# Patient Record
Sex: Male | Born: 1970 | Race: White | Hispanic: No | Smoking: Current every day smoker
Health system: Southern US, Community
[De-identification: ages and names within clinical notes are randomized; demographics above are authoritative.]

## PROBLEM LIST (undated history)

## (undated) DIAGNOSIS — S069XAA Unspecified intracranial injury with loss of consciousness status unknown, initial encounter: Secondary | ICD-10-CM

## (undated) DIAGNOSIS — S069X9A Unspecified intracranial injury with loss of consciousness of unspecified duration, initial encounter: Secondary | ICD-10-CM

---

## 2007-01-16 ENCOUNTER — Ambulatory Visit (HOSPITAL_COMMUNITY): Admission: RE | Admit: 2007-01-16 | Discharge: 2007-01-16 | Payer: Self-pay | Admitting: Family Medicine

## 2007-12-01 ENCOUNTER — Emergency Department (HOSPITAL_COMMUNITY): Admission: EM | Admit: 2007-12-01 | Discharge: 2007-12-01 | Payer: Self-pay | Admitting: Emergency Medicine

## 2008-03-15 ENCOUNTER — Inpatient Hospital Stay (HOSPITAL_COMMUNITY): Admission: EM | Admit: 2008-03-15 | Discharge: 2008-03-16 | Payer: Self-pay | Admitting: Emergency Medicine

## 2009-12-19 ENCOUNTER — Emergency Department (HOSPITAL_COMMUNITY): Admission: EM | Admit: 2009-12-19 | Discharge: 2009-12-19 | Payer: Self-pay | Admitting: Emergency Medicine

## 2010-10-26 NOTE — H&P (Signed)
NAME:  Jordan Wise, Jordan Wise                 ACCOUNT NO.:  [id number]   MEDICAL RECORD NO.:  [id number]          PATIENT TYPE:  INP   LOCATION:  A302                          FACILITY:  [facility]   PHYSICIAN:  Dorris Singh, DO    DATE OF BIRTH:  May 11, 1971   DATE OF ADMISSION:  03/15/2008  DATE OF DISCHARGE:  LH                              HISTORY & PHYSICAL   HISTORY OF PRESENT ILLNESS:  The patient is a 40 year old Caucasian male  who presented to the emergency room via EMS after being found passed out  by his girlfriend in his room.  The patient states that prior to this he  had been trying to pick up a plow, and threw his back out.  It took him  about 10 minutes to gather himself to get together, and he ended up  taking some pain medication that he had at home, then he went over to a  friend's house and took some morphine that his friend had.  All reports  stated that he took morphine, Vicodin, Valium.  When his girlfriend came  home, she found him passed out and unresponsive.  The patient was then  admitted to the service of [facility] with an apparent suicide attempt.  He took about three Valium, three hydrocodone and Xanax, and like I  said, what was revealed up on the hospital floor that he also did have  some morphine.   PRIMARY CARE PHYSICIAN:  Dr. Regino Schultze   ALLERGIES:  NO KNOWN DRUG ALLERGIES.   SOCIAL HISTORY:  Currently, he lives with his fiance.  He smokes about  two packs a day.  Just recently got out of jail for 45 days worth of  jail.  He is a former drinker and former drug user.   PAST MEDICAL HISTORY:  1. Arthritis and chronic back pain.  2. He is taking Celebrex 300 mg twice a day.  3. Diazepam 10 mg p.o. every 6 hours.  4. Hydrocodone/acetaminophen 10/650.  5. Mobic 15 mg once a day.   REVIEW OF SYSTEMS:  At that time, was positive unresponsiveness.  The  patient actually has improved.  He is able to answer questions  appropriately.  All the others are negative,  except for musculoskeletal  is positive back pain.   PHYSICAL EXAMINATION:  VITAL SIGNS:  For today are as follows,  temperature is 98.7, pulse 76, respirations 20, blood pressure 124/68.  GENERAL:  The patient is a 40 year old Caucasian male who is well-  developed, well-nourished in no acute distress.  HEENT:  Head is normocephalic, atraumatic.  Eyes: He does have a  deviated left eye, and they are bloodshot.  Conjunctive is red  bilaterally.  Nose:  Turbinates are moist.  Teeth are in poor repair.  NECK:  Supple.  CHEST:  Symmetrical respirations.  HEART:  Regular rate and rhythm.  LUNGS:  Clear to auscultation.  However, coarse breath sounds  throughout.  ABDOMEN:  Soft, nontender, nondistended, no organomegaly noted.  EXTREMITIES:  Positive pulses.  No ecchymosis, cyanosis or edema.   LABORATORY DATA:  Labs that were  done today include a blood gas which  showed pH of 7.29, CO2 was critical at the time, and his bicarbonate was  28, pCO2 30, white count of 6.0, hemoglobin 14.3, hematocrit 42.0,  platelet count 235.  Sodium 142, potassium 3.8, chloride 103, glucose  126, BUN 13, creatinine 1.4.  First set of cardiac enzymes were  negative.  Acetaminophen is less than one.  He is positive for  benzodiazepines, opiates and for pot.  For cocaine, he is negative.  Alcohol of less than 5.   ASSESSMENT:  1. Opiate overdose as well as benzodiazepine overdose.  2. Tobacco abuse.  3. Chronic pain   PLAN:  The patient was admitted to the service of [facility].  At the  time of admission, he actually became more alert and EKG was done as  well as blood work.  He was given Narcan as well in the ED which seemed  to revise him.  So, we will do DVT and GI prophylaxis.  Will increase  his diet as tolerated.  Will have the ACT team come see the patient to  evaluate him.  Will also put him on Nicoderm patch due to the fact that  he smokes two packs a day.  Have counseled the patient on smoking  as  well as use of narcotics and illicit drug use.  Will go ahead and  continue to monitor him as long as he continues to do well, and after he  is seen by ACT team, we will plan on discharge planning in 1-2 days.      Dorris Singh, DO  Electronically Signed     CB/MEDQ  D:  03/15/2008  T:  03/16/2008  Job:  [id number]   cc:   Kirk Ruths, M.D.  Fax: [phone]

## 2010-10-26 NOTE — Discharge Summary (Signed)
NAME:  Jordan Wise, [place]                 ACCOUNT NO.:  [id number]   MEDICAL RECORD NO.:  [id number]          PATIENT TYPE:  INP   LOCATION:  A302                          FACILITY:  [facility]   PHYSICIAN:  Skeet Latch, DO    DATE OF BIRTH:  Dec 22, 1970   DATE OF ADMISSION:  03/15/2008  DATE OF DISCHARGE:  10/04/2009LH                               DISCHARGE SUMMARY   DISCHARGE DIAGNOSES:  1. Opioid, and is overdosed.  2. Tobacco abuse.  3. History of chronic pain.   PRIMARY CARE PHYSICIAN:  Kirk Ruths, MD   BRIEF HOSPITAL COURSE:  This is a 40 year old Caucasian male presented  to the ER via EMS, was found passed out by his girlfriend in his room.  The patient states that he was trying to pick up a plow, hurt his back  and about 10 minutes to gather himself and ended up taking some pain  medication he had at home.  The patient went over a friend's house,  apparently he took morphine, Vicodin, and Valium.  When his girlfriend  came home, he was found unresponsive.  The patient was then brought to  the emergency room via ambulance with a questionable suicide attempt.  Apparently, he took about 3 Valium, 3 hydrocodone, and Xanax and  apparently also morphine.  The patient was admitted for drug overdose.  He became alert in the emergency room prior to being intubated.  He was  given Narcan in the emergency room also.  The patient has been stable,  subsequently being admitted to the [facility].  The patient was  placed on IV fluid and his diet was increased.  The patient had all  narcotics and sedatives held.  His initial labs showed sodium 142,  potassium 3.8, chloride 103, glucose 126, BUN 13, and creatinine 1.4.  The patient had cardiac enzymes.  Last cardiac enzyme showed a total CK  of 39, CK-MB 0.4, and troponin less than 0.03.  AST 43, ALT 54, and  total protein 5.8.  The patient had a repeat ABG, showed a pH of 7.394,  PCO2 of 48.4, PO2 of 82.2, and bicarb 28.9.  Chest  x-ray showed no acute  abnormalities.  At this time, we feel the patient is stable to be sent  home.  The patient did see ACT team and has signed safety contract with  the ACT team.   VITALS ON DISCHARGE:  Temperature 99.4, pulse 86, respirations 16, and  blood pressure 129/73.   CONDITION ON DISCHARGE:  Stable.   DISPOSITION:  The patient will be discharged to home.   DISCHARGE INSTRUCTIONS:  The patient is to follow up with Dr. Regino Schultze in  the next 1-2 days.  The patient has had no restrictions on diet.  Increase his activity slowly.  The patient is only to take medications  as directed.   DISCHARGE MEDICATIONS:  Diazepam 10 mg every 6 hours, Celebrex 300 mg  daily, and Tylenol 650 mg every 4-6 hours as needed for pain and fever.   The patient is to return to emergency room for any  major symptoms at  this time.      Skeet Latch, DO  Electronically Signed     SM/MEDQ  D:  03/16/2008  T:  03/16/2008  Job:  [id number]   cc:   Kirk Ruths, M.D.  Fax: [phone]

## 2010-11-24 ENCOUNTER — Emergency Department (HOSPITAL_COMMUNITY): Payer: Worker's Compensation

## 2010-11-24 ENCOUNTER — Inpatient Hospital Stay (HOSPITAL_COMMUNITY)
Admission: AD | Admit: 2010-11-24 | Discharge: 2010-11-29 | DRG: 564 | Disposition: A | Discharge status: Home / Self Care | Payer: Worker's Compensation | Source: Other Acute Inpatient Hospital | Attending: General Surgery | Admitting: General Surgery

## 2010-11-24 ENCOUNTER — Emergency Department (HOSPITAL_COMMUNITY)
Admission: EM | Admit: 2010-11-24 | Discharge: 2010-11-24 | Disposition: A | Discharge status: Other Acute Inpatient Hospital | Payer: Worker's Compensation | Source: Home / Self Care | Attending: Emergency Medicine | Admitting: Emergency Medicine

## 2010-11-24 DIAGNOSIS — F17200 Nicotine dependence, unspecified, uncomplicated: Secondary | ICD-10-CM | POA: Diagnosis present

## 2010-11-24 DIAGNOSIS — S1093XA Contusion of unspecified part of neck, initial encounter: Secondary | ICD-10-CM | POA: Insufficient documentation

## 2010-11-24 DIAGNOSIS — W208XXA Other cause of strike by thrown, projected or falling object, initial encounter: Secondary | ICD-10-CM | POA: Diagnosis present

## 2010-11-24 DIAGNOSIS — L723 Sebaceous cyst: Secondary | ICD-10-CM | POA: Diagnosis present

## 2010-11-24 DIAGNOSIS — K089 Disorder of teeth and supporting structures, unspecified: Secondary | ICD-10-CM | POA: Insufficient documentation

## 2010-11-24 DIAGNOSIS — S0180XA Unspecified open wound of other part of head, initial encounter: Secondary | ICD-10-CM | POA: Diagnosis present

## 2010-11-24 DIAGNOSIS — R51 Headache: Secondary | ICD-10-CM | POA: Insufficient documentation

## 2010-11-24 DIAGNOSIS — R000 Tachycardia, unspecified: Secondary | ICD-10-CM | POA: Insufficient documentation

## 2010-11-24 DIAGNOSIS — M549 Dorsalgia, unspecified: Secondary | ICD-10-CM | POA: Insufficient documentation

## 2010-11-24 DIAGNOSIS — S0003XA Contusion of scalp, initial encounter: Secondary | ICD-10-CM | POA: Insufficient documentation

## 2010-11-24 DIAGNOSIS — S0280XA Fracture of other specified skull and facial bones, unspecified side, initial encounter for closed fracture: Principal | ICD-10-CM | POA: Diagnosis present

## 2010-11-24 DIAGNOSIS — S02109A Fracture of base of skull, unspecified side, initial encounter for closed fracture: Secondary | ICD-10-CM | POA: Diagnosis present

## 2010-11-24 DIAGNOSIS — Y92009 Unspecified place in unspecified non-institutional (private) residence as the place of occurrence of the external cause: Secondary | ICD-10-CM

## 2010-11-24 DIAGNOSIS — S020XXA Fracture of vault of skull, initial encounter for closed fracture: Secondary | ICD-10-CM | POA: Insufficient documentation

## 2010-11-24 DIAGNOSIS — Y93H2 Activity, gardening and landscaping: Secondary | ICD-10-CM

## 2010-11-24 DIAGNOSIS — M542 Cervicalgia: Secondary | ICD-10-CM | POA: Insufficient documentation

## 2010-11-24 DIAGNOSIS — Y998 Other external cause status: Secondary | ICD-10-CM | POA: Insufficient documentation

## 2010-11-24 DIAGNOSIS — I10 Essential (primary) hypertension: Secondary | ICD-10-CM | POA: Insufficient documentation

## 2010-11-24 DIAGNOSIS — S0230XA Fracture of orbital floor, unspecified side, initial encounter for closed fracture: Secondary | ICD-10-CM | POA: Insufficient documentation

## 2010-11-24 DIAGNOSIS — Y999 Unspecified external cause status: Secondary | ICD-10-CM

## 2010-11-24 DIAGNOSIS — S0990XA Unspecified injury of head, initial encounter: Secondary | ICD-10-CM | POA: Insufficient documentation

## 2010-11-24 DIAGNOSIS — S022XXA Fracture of nasal bones, initial encounter for closed fracture: Secondary | ICD-10-CM | POA: Diagnosis present

## 2010-11-24 LAB — MRSA PCR SCREENING: MRSA by PCR: NEGATIVE

## 2010-11-24 LAB — POCT I-STAT, CHEM 8
Chloride: 103 mEq/L (ref 96–112)
Creatinine, Ser: 1 mg/dL (ref 0.4–1.5)
Glucose, Bld: 101 mg/dL — ABNORMAL HIGH (ref 70–99)
Sodium: 138 mEq/L (ref 135–145)
TCO2: 26 mmol/L (ref 0–100)

## 2010-11-25 LAB — BASIC METABOLIC PANEL
CO2: 28 mEq/L (ref 19–32)
Calcium: 8.2 mg/dL — ABNORMAL LOW (ref 8.4–10.5)
Chloride: 101 mEq/L (ref 96–112)
Creatinine, Ser: 0.75 mg/dL (ref 0.4–1.5)
GFR calc Af Amer: >60 mL/min (ref >60)
Glucose, Bld: 90 mg/dL (ref 70–99)
Potassium: 3.3 mEq/L — ABNORMAL LOW (ref 3.5–5.1)
Sodium: 137 mEq/L (ref 135–145)

## 2010-11-25 LAB — CBC
HCT: 38.1 % — ABNORMAL LOW (ref 39.0–52.0)
Hemoglobin: 13.2 g/dL (ref 13.0–17.0)
MCH: 32.6 pg (ref 26.0–34.0)
MCHC: 34.6 g/dL (ref 30.0–36.0)
MCV: 94.1 fL (ref 78.0–100.0)
RDW: 12.9 % (ref 11.5–15.5)

## 2010-11-26 ENCOUNTER — Inpatient Hospital Stay (HOSPITAL_COMMUNITY): Payer: Worker's Compensation

## 2010-11-26 LAB — BASIC METABOLIC PANEL
CO2: 33 mEq/L — ABNORMAL HIGH (ref 19–32)
GFR calc non Af Amer: >60 mL/min (ref >60)
Glucose, Bld: 97 mg/dL (ref 70–99)
Potassium: 3.8 mEq/L (ref 3.5–5.1)
Sodium: 138 mEq/L (ref 135–145)

## 2010-11-26 LAB — CBC
Hemoglobin: 12.3 g/dL — ABNORMAL LOW (ref 13.0–17.0)
MCH: 31.6 pg (ref 26.0–34.0)
MCHC: 33.6 g/dL (ref 30.0–36.0)
RDW: 12.7 % (ref 11.5–15.5)

## 2010-11-26 NOTE — Consult Note (Signed)
NAME:  Jordan Wise, Jordan Wise                 ACCOUNT NO.:  [id number]  MEDICAL RECORD NO.:  [id number]  LOCATION:  3103                         FACILITY:  [facility]  PHYSICIAN:  Hewitt Shorts, M.D.DATE OF BIRTH:  1970/06/24  DATE OF CONSULTATION:  11/24/2010 DATE OF DISCHARGE:                                CONSULTATION   HISTORY OF PRESENT ILLNESS:  The patient is a 40 year old right-handed white male who sustained a multiple trauma this afternoon.  He reports that he was cutting down a tree about a foot and a half in diameter. As it fell, it struck a rock and bounced back and struck him in the left craniofacial region.  He had a brief loss of conscious about a minute or so and then was taken to the [facility] Emergency Room, evaluated by Dr. Rhea Bleacher, the emergency room physician including CT scan of the head and face and he was found to have significant craniofacial trauma.  Arrangements were made for transfer to the Trauma Surgical Service at [facility] and neurosurgical consultation was requested.  Symptomatically he complains of headache.  He does not describe nausea at this time.  He knows that his vision is good in both eyes.  PAST MEDICAL HISTORY:  He denies any history of hypertension, myocardial function, cancer, stroke, diabetes, peptic ulcer disease, or lung disease.  No previous surgeries.  No known allergies.  He takes no medications on a regular basis.  FAMILY HISTORY:  The patient's father is in his late 52s and has heart disease.  The patient's mother died at about age 4 of breast cancer.  SOCIAL HISTORY:  The patient is divorced.  He smokes a pack and half a day.  He has been smoking for 22 years.  He drinks alcoholic beverages rarely.  REVIEW OF SYSTEMS:  Notable as described in history of present illness. 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  He is followed by an optometrist in [place]. Review of systems is otherwise unremarkable.  PHYSICAL EXAMINATION:  GENERAL:  The patient is a well-developed, well- nourished white male in no acute distress. VITAL SIGNS:  Temperature is 99.3, pulse 105, blood pressure 122/79, respiratory rate 25, oxygen saturation 96%. LUNGS:  Clear to auscultation, has symmetric respiratory excursion. HEART:  Mild sinus tachycardia.  Normal S1 and S2.  There is no murmur. ABDOMEN:  Soft, nondistended, nontender.  Bowel sounds present. EXTREMITIES:  No clubbing, cyanosis, or edema. MUSCULOSKELETAL:  No tenderness to palpation of the cervical, spinous process, or paracervical musculature with good mobility of the neck in all directions. External examination shows ecchymosis around the left periorbital tissues, a sutured laceration along the left brow, no Battle sign, no hemotympanum, and no raccoon eye on the right.  The nose and nasal bone structures are deviated left to right. NEUROLOGIC:  The patient is awake, he is alert, he is oriented to his name, [facility], November 24, 2010, follows commands.  His speech is fluent.  He has good comprehension.  Cranial nerves shows pupils  were equal, round, and reactive to light constricting from 6-3 mm to light stimulus.  He has no Marcus Gunn pupil.  His gaze is disconjugate but extraocular movements are intact.  He is able to up gaze and down gaze as well as lateral gaze to each side.  Facial movement is symmetrical, although the face itself to the trauma is asymmetric.  Hearing is present bilaterally.  Palatal movements are symmetrical.  Shoulder shrug is symmetrical.  Tongue is midline.  Motor examination shows 5/5 strength in the upper and lower extremities.  He has no drift in the upper extremities.  He is moving all extremities well.  Sensation is intact to pinprick throughout.  Reflexes symmetrical.  Gait is assessed but  not tested due to nature of this condition.  DIAGNOSTIC STUDIES:  CT scan of the head was reviewed including CT of the face.  There was complex fracture involving the frontal bone orbital structures, maxillary sinus, and nasal ethmoid sinuses and frontal sinus.  There was depressed fracture of the anterior wall of the left frontal sinus as well as the superior left orbital rim.  There is fracture of the superior wall (roof of orbit/floor of frontal fossa), medial wall and inferior wall of the orbit as well as fracture of the lateral wall of the left maxillary sinus.  There is mild pneumocephalus and air also within the left orbit and minimal intracranial hemorrhage without significant mass effect.  There was no shift.  IMPRESSION:  Significant craniofacial trauma involving the frontal bone, the left orbit, and the frontal nasal ethmoid and maxillary sinuses. Neurologically intact with good vision in both eyes.  I discussed my assessment and recommendations with the patient, his father as well as with Dr. Jimmye Norman, the admitting Trauma Surgical Service attending.  I feel he should be kept n.p.o. initially, supported with IV fluids, followed with close vital signs and neuro checks and a followup CT scan of the brain to be obtained without contrast on the morning of Friday, June 15 unless he has any neurologic deterioration in which case we would want to obtain the studies sooner.  I favor facial trauma surgery consultation as well as ophthalmology consultation.  I will continue to follow the patient along with the Trauma Surgical Service.     Hewitt Shorts, M.D.     RWN/MEDQ  D:  11/24/2010  T:  11/25/2010  Job:  [id number]  Electronically Signed by Shirlean Kelly M.D. on 11/26/2010 07:21:13 AM

## 2010-11-26 NOTE — Consult Note (Signed)
  NAME:  List, [place]                 ACCOUNT NO.:  [id number]  MEDICAL RECORD NO.:  [id number]  LOCATION:  3103                         FACILITY:  [facility]  PHYSICIAN:  Georgia Lopes, M.D.  DATE OF BIRTH:  [birth year]  DATE OF CONSULTATION:  11/24/2010 DATE OF DISCHARGE:                                CONSULTATION   HISTORY OF PRESENT ILLNESS:  I was asked by Dr. Lindie Spruce of Trauma Service to see this 40 year old white male status post being hit in the head by a tree he was cutting down earlier this evening.  He sustained laceration to the left eyebrow, left frontal fracture, left superior orbital rim fracture, nasal fracture, pneumocephalus and maxillary fractures on the left side.  He had a positive loss of consciousness of approximately 1-2 minutes.  He denies visual changes, diplopia, facial numbness or pain anywhere else except for the left eye region.  ALLERGIES:  No known drug allergies.  MEDICATIONS:  None.  PAST MEDICAL HISTORY:  Negative.  SOCIAL HISTORY:  Positive tobacco.  PAST SURGICAL HISTORY:  Negative.  PHYSICAL EXAMINATION:  GENERAL:  Well-nourished, well-developed 40 year old white male with obvious left periorbital trauma. HEAD:  Normocephalic and atraumatic. EYES:  PERRLA.  EOMI.  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  Negative diplopia.  Sutured laceration over the left eyebrow.  There is a 1-cm step defect at the left superior orbital rim laterally and at the midportion of the orbital rim.  This is displaced inward. NOSE:  Nasal septum is midline.  No hemorrhage. THROAT:  Edentulous maxilla.  No trismus.  Pharynx is clear.  IMAGING:  CT scan results significant for frontal bone fracture, left frontal pneumocephalus, left orbital fracture of the medial and lateral walls as well as orbital floor, minimally displaced maxillary antral wall fracture and nasal fracture.  IMPRESSION:  This is a 40 year old white male with left frontal  bone fracture, orbital fractures, sinus wall fracture, and nasal fracture.  PLAN:  The patient has asked that I discuss this with his father and him tomorrow.  In my opinion, this superior orbital rim fracture may need reduction for cosmetic reasons as there is a significant step deformity and depression of this bone.  We will discuss this with the patient and his father tomorrow and consider ORIF when the patient is stable from a neurosurgery standpoint.     Georgia Lopes, M.D.     SMJ/MEDQ  D:  11/24/2010  T:  11/25/2010  Job:  [id number]  Electronically Signed by Ocie Doyne M.D. on 11/26/2010 10:54:01 AM

## 2010-11-30 ENCOUNTER — Other Ambulatory Visit (HOSPITAL_COMMUNITY): Payer: Self-pay | Admitting: Neurosurgery

## 2010-11-30 DIAGNOSIS — S020XXB Fracture of vault of skull, initial encounter for open fracture: Secondary | ICD-10-CM

## 2010-12-09 ENCOUNTER — Ambulatory Visit (HOSPITAL_COMMUNITY)
Admission: RE | Admit: 2010-12-09 | Discharge: 2010-12-09 | Disposition: A | Discharge status: Home / Self Care | Payer: BC Managed Care – PPO | Source: Ambulatory Visit | Attending: Neurosurgery | Admitting: Neurosurgery

## 2010-12-09 DIAGNOSIS — S069X9A Unspecified intracranial injury with loss of consciousness of unspecified duration, initial encounter: Secondary | ICD-10-CM | POA: Insufficient documentation

## 2010-12-09 DIAGNOSIS — R51 Headache: Secondary | ICD-10-CM | POA: Insufficient documentation

## 2010-12-09 DIAGNOSIS — X58XXXA Exposure to other specified factors, initial encounter: Secondary | ICD-10-CM | POA: Insufficient documentation

## 2010-12-09 DIAGNOSIS — S020XXB Fracture of vault of skull, initial encounter for open fracture: Secondary | ICD-10-CM | POA: Insufficient documentation

## 2010-12-14 NOTE — Discharge Summary (Signed)
NAME:  Jordan Wise, Jordan Wise                 ACCOUNT NO.:  [id number]  MEDICAL RECORD NO.:  [id number]  LOCATION:  3019                         FACILITY:  [facility]  PHYSICIAN:  Cherylynn Ridges, M.D.    DATE OF BIRTH:  [birth year]  DATE OF ADMISSION:  11/24/2010 DATE OF DISCHARGE:  11/29/2010                              DISCHARGE SUMMARY   The patient was discharged to home.  ADMITTING SURGEON:  Cherylynn Ridges, MD  CONSULTANTS: 1. Hewitt Shorts, MD, Neurosurgery. 2. Georgia Lopes, MD, oral maxillofacial.  DISCHARGE DIAGNOSES: 1. Blunt head trauma, struck in the head by a tree limb. 2. 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 3. Maxillary sinus fracture. 4. Nasal ethmoid sinus fracture. 5. Left orbital fractures.  HISTORY ON ADMISSION:  This is a 40 year old male who was cutting a tree down and as the tree fell it struck a rock and then bounced back and struck the patient in the left craniofacial area.  He had a brief lossof consciousness and was taken to [facility] for evaluation.  CT scan of the head and face was found to show significant craniofacial trauma and the patient was transferred to [facility] Trauma Service for further evaluation and treatment.  The patient was admitted.  He was seen in consultation per Neurosurgery and Facial Surgery.  Again, he had a complex fracture involving the left frontal bone, orbital structures, maxillary sinus, nasal, ethmoid sinus. There was a depressed fracture of the anterior wall of the left frontal sinus as well as the superior rim of the left orbit.  There was a fracture of the superior wall, medial wall, and inferior wall of the orbit and lateral wall of the left maxillary sinus.  He did have some mild pneumocephalus and air within the left orbit as well and some very minimal intracranial hemorrhage without significant mass effect or shift.  He was seen in consultation by Neurosurgical and Maxillofacial Services as noted  and it was felt that he should undergo repair.  He had no evidence of CSF leak.  He does have continued deformity.  No signs of meningitis.  He is going to follow up with Dr. Barbette Merino and Dr. Newell Coral for possible delayed repair.  He was incidentally treated for an epidermoid inclusion cyst over the left buttock with doxycycline and this does seem to be improving.  He can follow up with [facility] as needed for I and D of this.  At this time, the patient is medically stable and his pain is under control with oral pain medication.  MEDICATIONS AT DISCHARGE: 1. Doxycycline 100 mg p.o. b.i.d. times a total of 10 days. 2. Hydrocodone/APAP 5/325 mg 1-2 p.o. q.4 h. p.r.n. pain, #60, no     refills. 3. Oxycodone 5-10 mg p.o. q.4 h. p.r.n. breakthrough pain not relieved     by hydrocodone. 4. Nicotine patch 21 mg change daily and taper per package     instructions.  Again, the patient will follow up with Dr. Barbette Merino and Dr. Newell Coral in 1 week.  Follow up with Trauma Service as needed.  He will not be able to return to work until he  is seen Dr. Barbette Merino and/or Dr. Newell Coral and then as per their instructions.     Lazaro Arms, P.A.   ______________________________ Cherylynn Ridges, M.D.    SR/MEDQ  D:  11/29/2010  T:  11/30/2010  Job:  [id number]  Electronically Signed by Lazaro Arms P.A. on 11/30/2010 07:22:41 PM Electronically Signed by Jimmye Norman M.D. on 12/14/2010 08:17:11 AM

## 2010-12-21 ENCOUNTER — Other Ambulatory Visit (HOSPITAL_COMMUNITY): Payer: Self-pay | Admitting: Neurosurgery

## 2010-12-21 DIAGNOSIS — S020XXB Fracture of vault of skull, initial encounter for open fracture: Secondary | ICD-10-CM

## 2010-12-21 DIAGNOSIS — R51 Headache: Secondary | ICD-10-CM

## 2010-12-30 ENCOUNTER — Ambulatory Visit (HOSPITAL_COMMUNITY)
Admission: RE | Admit: 2010-12-30 | Discharge: 2010-12-30 | Disposition: A | Discharge status: Home / Self Care | Payer: BC Managed Care – PPO | Source: Ambulatory Visit | Attending: Neurosurgery | Admitting: Neurosurgery

## 2010-12-30 ENCOUNTER — Encounter (HOSPITAL_COMMUNITY): Payer: Self-pay

## 2010-12-30 DIAGNOSIS — X58XXXA Exposure to other specified factors, initial encounter: Secondary | ICD-10-CM | POA: Insufficient documentation

## 2010-12-30 DIAGNOSIS — S020XXB Fracture of vault of skull, initial encounter for open fracture: Secondary | ICD-10-CM

## 2010-12-30 DIAGNOSIS — S0990XA Unspecified injury of head, initial encounter: Secondary | ICD-10-CM | POA: Insufficient documentation

## 2010-12-30 DIAGNOSIS — R51 Headache: Secondary | ICD-10-CM

## 2010-12-30 HISTORY — DX: Unspecified intracranial injury with loss of consciousness status unknown, initial encounter: S06.9XAA

## 2010-12-30 HISTORY — DX: Unspecified intracranial injury with loss of consciousness of unspecified duration, initial encounter: S06.9X9A

## 2011-03-14 LAB — BLOOD GAS, ARTERIAL
Acid-Base Excess: 3 — ABNORMAL HIGH
Acid-Base Excess: 4.3 — ABNORMAL HIGH
Bicarbonate: 28.9 — ABNORMAL HIGH
Patient temperature: 37
TCO2: 24.8
TCO2: 25.7
pCO2 arterial: 48.4 — ABNORMAL HIGH
pCO2 arterial: 59.7
pH, Arterial: 7.292 — ABNORMAL LOW
pH, Arterial: 7.375

## 2011-03-14 LAB — COMPREHENSIVE METABOLIC PANEL
ALT: 56 — ABNORMAL HIGH
AST: 46 — ABNORMAL HIGH
Alkaline Phosphatase: 103
BUN: 6
CO2: 29
Chloride: 103
Chloride: 106
Creatinine, Ser: 0.88
GFR calc Af Amer: >60
GFR calc non Af Amer: >60
Glucose, Bld: 102 — ABNORMAL HIGH
Glucose, Bld: 91
Potassium: 3.3 — ABNORMAL LOW
Total Bilirubin: 0.4
Total Bilirubin: 0.8

## 2011-03-14 LAB — POCT I-STAT, CHEM 8
BUN: 13
Calcium, Ion: 1.1 — ABNORMAL LOW
Chloride: 103
Creatinine, Ser: 1.4
Glucose, Bld: 126 — ABNORMAL HIGH

## 2011-03-14 LAB — PROTIME-INR: INR: 1

## 2011-03-14 LAB — RAPID URINE DRUG SCREEN, HOSP PERFORMED
Amphetamines: NOT DETECTED
Opiates: POSITIVE — AB
Tetrahydrocannabinol: POSITIVE — AB

## 2011-03-14 LAB — CARDIAC PANEL(CRET KIN+CKTOT+MB+TROPI)
CK, MB: 0.4
CK, MB: 0.5
CK, MB: 0.7
Relative Index: INVALID
Total CK: 39
Total CK: 65
Troponin I: 0.03
Troponin I: 0.05

## 2011-03-14 LAB — CBC
HCT: 39.3
Hemoglobin: 13.5
Hemoglobin: 14
Platelets: 235
RBC: 4.29
WBC: 6
WBC: 9.3

## 2011-03-14 LAB — DIFFERENTIAL
Basophils Absolute: 0
Basophils Relative: 0
Eosinophils Absolute: 0.5
Monocytes Absolute: 0.5
Monocytes Absolute: 0.6
Monocytes Relative: 9
Neutro Abs: 7.2
Neutrophils Relative %: 43
Neutrophils Relative %: 77

## 2011-03-14 LAB — APTT: aPTT: 30

## 2011-03-14 LAB — ACETAMINOPHEN LEVEL: Acetaminophen (Tylenol), Serum: <10 — ABNORMAL LOW

## 2011-03-14 LAB — ETHANOL: Alcohol, Ethyl (B): <5

## 2012-08-17 IMAGING — CT CT CERVICAL SPINE W/O CM
3 of 9 series · 9 of 33 positions shown, 10 images · non-contrast
Comparison: None

CT HEAD

CLINICAL DATA: Injury with history fall long head, face and neck
with pain in these areas.

CT HEAD WITHOUT CONTRAST
CT MAXILLOFACIAL WITHOUT CONTRAST
CT CERVICAL SPINE WITHOUT CONTRAST
TECHNIQUE: Multidetector CT imaging of the head, cervical spine,
and maxillofacial structures were performed using the standard
protocol without intravenous contrast. Multiplanar CT image
reconstructions of the cervical spine and maxillofacial structures
were also generated.

[Series 6: max st coronal · coronal · 0.35mm/px · 1 of 63 slices shown]
[im 32/63  bone]
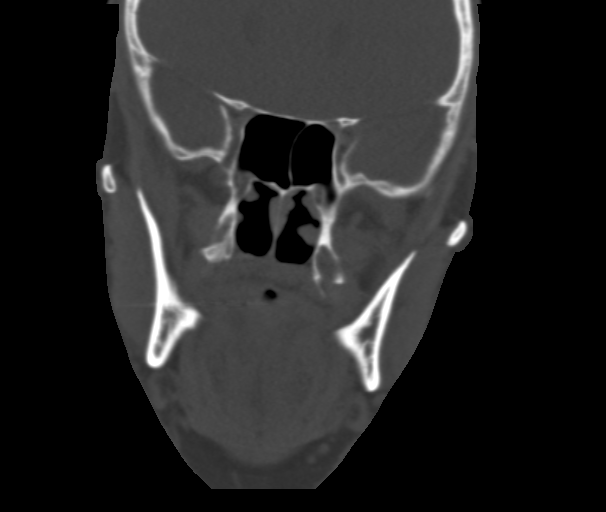

[Series 7: max st sag · sagittal · 0.32mm/px · 5 of 67 slices shown]
[im 12/67  bone]
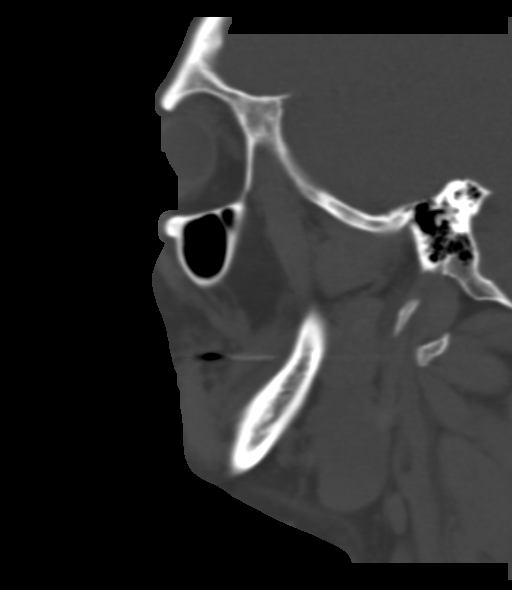
[im 23/67  bone]
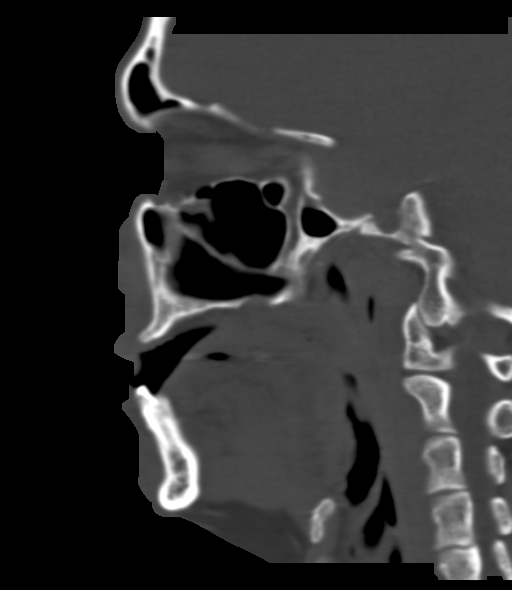
[im 34/67  bone]
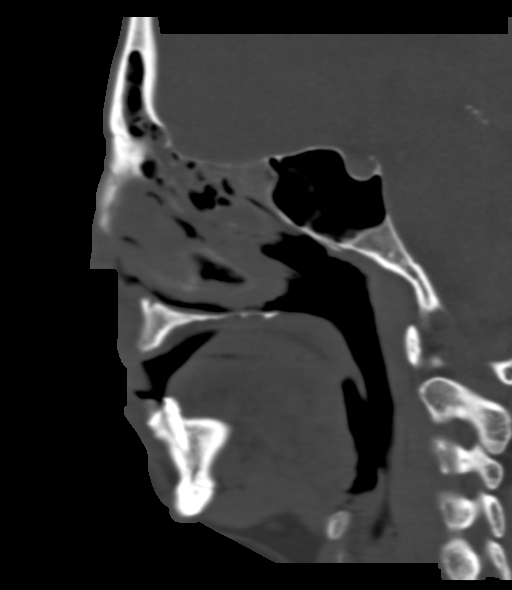
[im 45/67  bone]
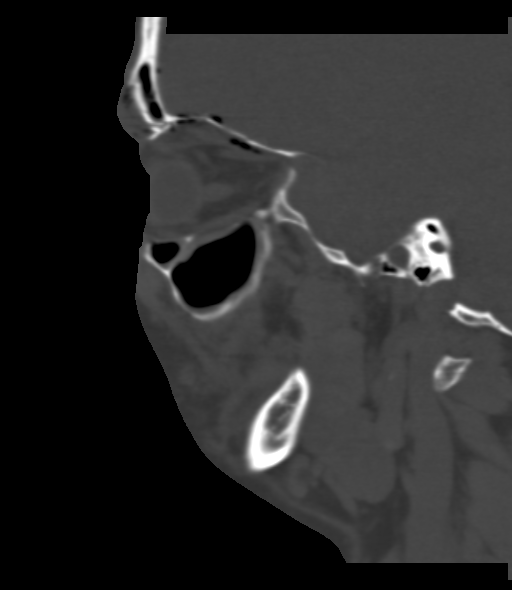
[im 56/67  bone]
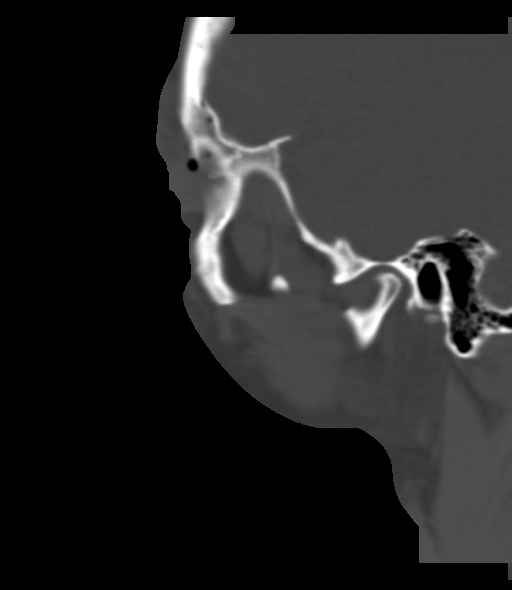

[Series 12: thins to (person_name) 1.0 · axial · 0.35mm/px · z∈[+94,+172]mm · 3 of 157 slices shown, 4 images]
[im 40/157  soft-tissue]
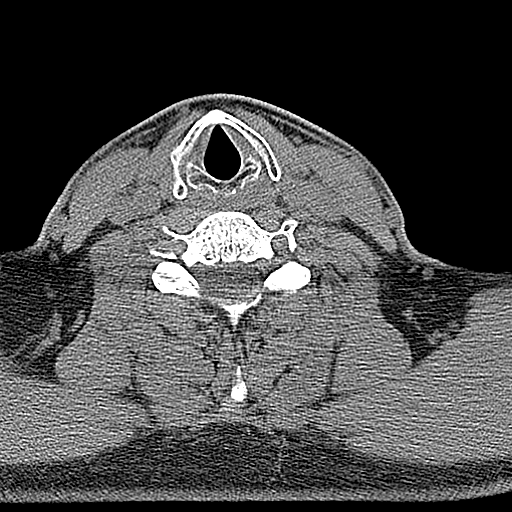
[im 40/157  bone]
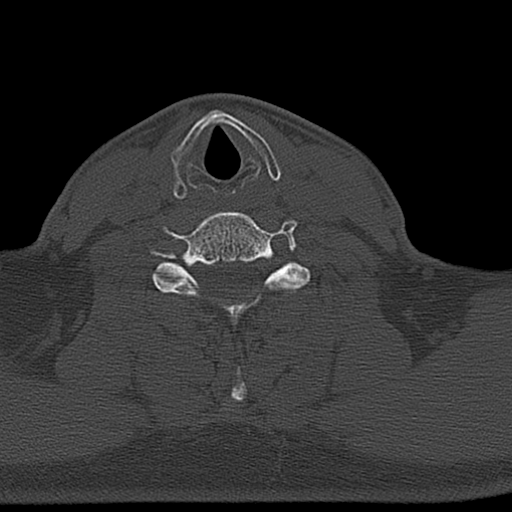
[im 79/157  bone]
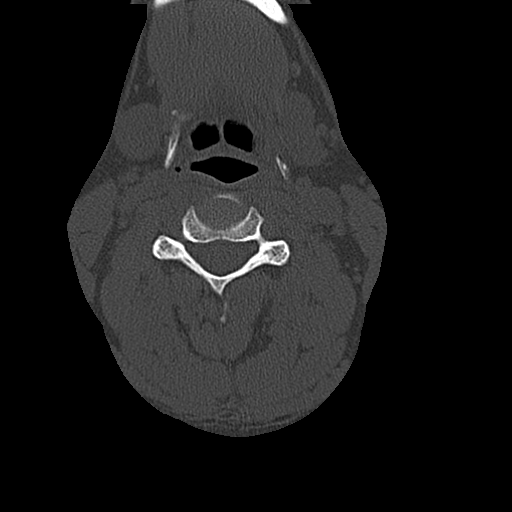
[im 118/157  bone]
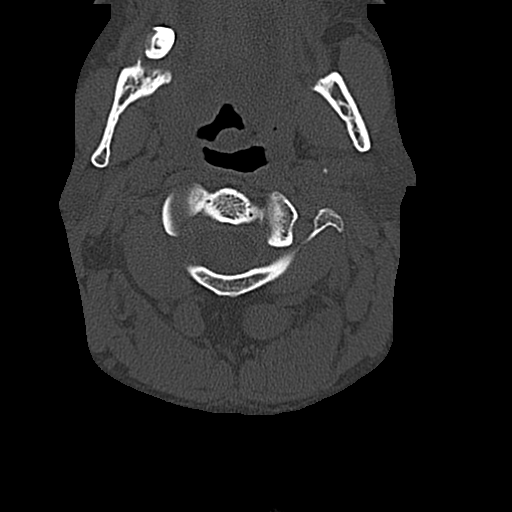

[9 of 33 positions shown; findings below may reference images not displayed]

FINDINGS: A mildly comminuted fracture is identified involving the
left frontal bone, frontal sinus, left orbital roof and medial left
orbital wall.

A small amount of left frontal pneumocephalus and a small amount of
hemorrhage along the inferior left frontal lobe are identified.
There is no evidence of subdural or epidural hematoma.
No evidence of midline shift, hydrocephalus, acute infarction, mass
lesion or mass effect identified.
Blood in the left maxillary sinus is identified.
The mastoid air cells and middle/inner ears are clear.
IMPRESSION: Fractures of the left frontal bone and left orbit with left frontal
pneumocephalus and small amount of hemorrhage along the inferior
left frontal lobe.

No evidence of subdural/epidural hematoma or midline shift.

CT MAXILLOFACIAL
FINDINGS: Fractures are noted involving the left frontal bone,
left frontal sinus, roof, medial wall and floor of the left orbit,
the anterior and posterior/lateral walls of the left maxillary
sinus, and bilateral nasal bones.  The left orbital floor fracture
is displaced 2.5 mm inferiorly at the junction with the medial
wall, but without gross evidence of extraocular muscle entrapment.
The globes bilaterally retain their spherical shape.
There is no evidence of intraconal abnormality.

A small amount of left frontal pneumocephalus is identified as well
as blood along the inferior left frontal lobe.

Blood in the left ethmoid sinuses and the left maxillary sinus are
noted.

There is no evidence of subluxation or dislocation.
IMPRESSION: Fractures involving the left frontal bone/frontal sinus, left
orbit, left maxillary sinus and nasal bones as described.

Small amount of left frontal pneumocephalus and hemorrhage along
the inferior left frontal lobe.

CT CERVICAL SPINE
FINDINGS: Normal alignment is noted.
There is no evidence of fracture, subluxation or prevertebral soft
tissue swelling.
The disc spaces are maintained.
No focal bony abnormalities are noted.
The soft tissue structures are within normal limits.
IMPRESSION: No static evidence of acute injury to the cervical spine.

## 2013-02-15 ENCOUNTER — Emergency Department (HOSPITAL_COMMUNITY)
Admission: EM | Admit: 2013-02-15 | Discharge: 2013-02-15 | Disposition: A | Discharge status: Home / Self Care | Payer: BC Managed Care – PPO | Attending: Emergency Medicine | Admitting: Emergency Medicine

## 2013-02-15 ENCOUNTER — Emergency Department (HOSPITAL_COMMUNITY): Payer: BC Managed Care – PPO

## 2013-02-15 ENCOUNTER — Encounter (HOSPITAL_COMMUNITY): Payer: Self-pay | Admitting: *Deleted

## 2013-02-15 DIAGNOSIS — F1410 Cocaine abuse, uncomplicated: Secondary | ICD-10-CM | POA: Insufficient documentation

## 2013-02-15 DIAGNOSIS — F1910 Other psychoactive substance abuse, uncomplicated: Secondary | ICD-10-CM

## 2013-02-15 DIAGNOSIS — F1110 Opioid abuse, uncomplicated: Secondary | ICD-10-CM | POA: Insufficient documentation

## 2013-02-15 DIAGNOSIS — F17200 Nicotine dependence, unspecified, uncomplicated: Secondary | ICD-10-CM | POA: Insufficient documentation

## 2013-02-15 DIAGNOSIS — Z87820 Personal history of traumatic brain injury: Secondary | ICD-10-CM | POA: Insufficient documentation

## 2013-02-15 LAB — CBC WITH DIFFERENTIAL/PLATELET
Eosinophils Relative: 2 % (ref 0–5)
HCT: 40.1 % (ref 39.0–52.0)
Hemoglobin: 13.8 g/dL (ref 13.0–17.0)
Lymphocytes Relative: 26 % (ref 12–46)
Lymphs Abs: 2.3 10*3/uL (ref 0.7–4.0)
MCV: 91.8 fL (ref 78.0–100.0)
Monocytes Absolute: 0.7 10*3/uL (ref 0.1–1.0)
Monocytes Relative: 8 % (ref 3–12)
Neutro Abs: 5.5 10*3/uL (ref 1.7–7.7)
RBC: 4.37 MIL/uL (ref 4.22–5.81)
WBC: 8.6 10*3/uL (ref 4.0–10.5)

## 2013-02-15 LAB — COMPREHENSIVE METABOLIC PANEL
CO2: 27 mEq/L (ref 19–32)
Calcium: 8.4 mg/dL (ref 8.4–10.5)
Chloride: 102 mEq/L (ref 96–112)
Creatinine, Ser: 1.03 mg/dL (ref 0.50–1.35)
GFR calc Af Amer: >90 mL/min (ref >90)
GFR calc non Af Amer: 89 mL/min — ABNORMAL LOW (ref >90)
Glucose, Bld: 174 mg/dL — ABNORMAL HIGH (ref 70–99)
Total Bilirubin: 0.2 mg/dL — ABNORMAL LOW (ref 0.3–1.2)

## 2013-02-15 LAB — RAPID URINE DRUG SCREEN, HOSP PERFORMED
Amphetamines: NOT DETECTED
Opiates: POSITIVE — AB

## 2013-02-15 LAB — TROPONIN I: Troponin I: <0.3 ng/mL (ref <0.30)

## 2013-02-15 LAB — SALICYLATE LEVEL: Salicylate Lvl: <2 mg/dL — ABNORMAL LOW (ref 2.8–20.0)

## 2013-02-15 NOTE — ED Provider Notes (Signed)
CSN: [id number]     Arrival date & time 02/15/13  1945 History   This chart was scribed for Jordan Octave, MD by Carl Best, ED Scribe. This patient was seen in room APA08/APA08 and the patient's care was started at 7:57 PM.    Chief Complaint  Patient presents with  . Altered Mental Status    The history is provided by the patient. No language interpreter was used.   HPI Comments: Jordan Wise is a 42 y.o. male who presents to the Emergency Department by EMS complaining of syncope.  He states that he was drinking whiskey, vodka, and taking morphine for back pain and his cousin's girlfriend called EMS after he passed out.  Patient denies emesis, fever, SI, chest pain, SOB, and HI.  Patient denies using heroin or other IV drugs.  Patient smokes a couple of packs of cigarettes and drinks an unknown amount of alcohol daily.  Patient has a h/o back problems.  Per report, EMS called due to pt being non-responsive. Pt was lethargic but responsive upon arrival. Pt does report drinking an unknown amount of alcohol today and used morphine IV today. Per EMS, pt reported to them that he used heroin and cocaine today, as well as snorted one percocet. Pt declines help for substance abuse at this time.   Past Medical History  Diagnosis Date  . TBI (traumatic brain injury)    History reviewed. No pertinent past surgical history. History reviewed. No pertinent family history. History  Substance Use Topics  . Smoking status: Current Every Day Smoker -- 1.00 packs/day    Types: Cigarettes  . Smokeless tobacco: Not on file  . Alcohol Use: Yes    Review of Systems A complete 10 system review of systems was obtained and all systems are negative except as noted in the HPI and PMH.   Allergies  Review of patient's allergies indicates no known allergies.  Home Medications  No current outpatient prescriptions on file. Triage Vitals: BP 114/55  Pulse 67  Temp(Src) 98.4 F (36.9 C) (Oral)  Resp  16  Ht 5\' 10"  (1.778 m)  Wt 185 lb (83.915 kg)  BMI 26.54 kg/m2  SpO2 92% Physical Exam  Nursing note and vitals reviewed. Constitutional: He is oriented to person, place, and time.  Disheveled and dirty  HENT:  Head: Normocephalic and atraumatic.  Right Ear: External ear normal.  Left Ear: External ear normal.  Eyes: Conjunctivae and EOM are normal. Pupils are equal, round, and reactive to light.  Neck: Normal range of motion and phonation normal. Neck supple.  Cardiovascular: Normal rate, regular rhythm, normal heart sounds and intact distal pulses.   Pulmonary/Chest: Effort normal and breath sounds normal. He exhibits no bony tenderness.  Abdominal: Soft. Normal appearance. There is no tenderness.  Musculoskeletal: Normal range of motion.  Neurological: He is alert and oriented to person, place, and time. He has normal strength. No cranial nerve deficit or sensory deficit. He exhibits normal muscle tone. Coordination normal.  CN 2-12 intact, no ataxia on finger to nose, no nystagmus, 5/5 strength throughout, no pronator drift, Romberg negative, normal gait.   Skin: Skin is warm, dry and intact.  Psychiatric: He has a normal mood and affect. His behavior is normal. Judgment and thought content normal.  Oriented, simulent but arouseable     ED Course  Procedures (including critical care time)  DIAGNOSTIC STUDIES: Oxygen Saturation is 92% on room air, low by my interpretation.    COORDINATION OF CARE:  8:02 PM- Discussed performing blood work, CXR, and CT with patient at bedside and patient agreed. 10:44 PM- RECHECK.  Discussed discharge plans with patient and patient agreed.  Patient denies HI and SI.   Labs Review Labs Reviewed  URINE RAPID DRUG SCREEN (HOSP PERFORMED) - Abnormal; Notable for the following:    Opiates POSITIVE (*)    Benzodiazepines POSITIVE (*)    All other components within normal limits  COMPREHENSIVE METABOLIC PANEL - Abnormal; Notable for the  following:    Glucose, Bld 174 (*)    Total Bilirubin 0.2 (*)    GFR calc non Af Amer 89 (*)    All other components within normal limits  SALICYLATE LEVEL - Abnormal; Notable for the following:    Salicylate Lvl <2.0 (*)    All other components within normal limits  ETHANOL  CBC WITH DIFFERENTIAL  ACETAMINOPHEN LEVEL  TROPONIN I   Imaging Review Dg Chest 2 View  02/15/2013   *RADIOLOGY REPORT*  Clinical Data: Altered mental status, weakness  CHEST - 2 VIEW  Comparison: 03/15/2008  Findings: Minimal enlargement of cardiac silhouette. Mediastinal contours and pulmonary vascularity normal. Bronchitic changes without infiltrate, pleural effusion or pneumothorax. No acute osseous findings.  IMPRESSION: Minimal enlargement of cardiac silhouette. Mild bronchitic changes.   Original Report Authenticated By: Ulyses Southward, M.D.   Ct Head Wo Contrast  02/15/2013   *RADIOLOGY REPORT*  Clinical Data: Remote Traumatic brain injury, altered mental status  CT HEAD WITHOUT CONTRAST  Technique:  Contiguous axial images were obtained from the base of the skull through the vertex without contrast.  Comparison: Head CT 12/30/2010  Findings: No acute intracranial hemorrhage.  No focal mass lesion. No CT evidence of acute infarction.   No midline shift or mass effect.  No hydrocephalus.  Basilar cisterns are patent.  Remote fracture of the left inferior frontal bone. Paranasal sinuses and mastoid air cells are clear.  Orbits are normal.  IMPRESSION: No acute intracranial findings.   Original Report Authenticated By: Genevive Bi, M.D.    MDM   1. Substance abuse    Patient presents by EMS after family called concerned that he was too intoxicated. Patient admits to drinking alcohol and using IV morphine today. Denies any heroine or cocaine contrary to triage note. He denies any suicidal or homicidal thoughts. He is alert and oriented and denies any pain or trauma.  CT head negative. Chest x-ray negative. Workup  remarkable for benzodiazepines and opiates in the drug screen. Alcohol negative. Patient states she was drinking liquor earlier today. His anion gap is normal. No suggestion of toxic alcohol ingestion.  He is alert and oriented. He denies any suicidality or homicidality. He is stable for discharge. He is ambulatory and tolerating PO.    Date: 02/15/2013  Rate: 66  Rhythm: normal sinus rhythm  QRS Axis: normal  Intervals: normal  ST/T Wave abnormalities: normal  Conduction Disutrbances:none  Narrative Interpretation:   Old EKG Reviewed: unchanged    I personally performed the services described in this documentation, which was scribed in my presence. The recorded information has been reviewed and is accurate.      Jordan Octave, MD 02/15/13 [phone]

## 2013-02-15 NOTE — ED Notes (Signed)
Pt to department via EMS.  Per report, EMS called due to pt being non-responsive.  Pt lethargic but responsive upon arrival.  Pt does report drinking unknown amount today.  Also reporting that he used morphine IV today.  Per EMS, pt reported to them that he used heroin and cocaine today, as well as snorted one percocet.  Pt declines help for substance abuse at this time.

## 2013-02-15 NOTE — ED Notes (Signed)
Patient is resting comfortably. 

## 2013-02-15 NOTE — ED Notes (Signed)
Patient given something to drink per RN approval. 

## 2013-11-29 ENCOUNTER — Emergency Department (HOSPITAL_COMMUNITY)
Admission: EM | Admit: 2013-11-29 | Discharge: 2013-11-29 | Disposition: A | Discharge status: Home / Self Care | Payer: BC Managed Care – PPO | Attending: Emergency Medicine | Admitting: Emergency Medicine

## 2013-11-29 ENCOUNTER — Encounter (HOSPITAL_COMMUNITY): Payer: Self-pay | Admitting: Emergency Medicine

## 2013-11-29 DIAGNOSIS — T4271XA Poisoning by unspecified antiepileptic and sedative-hypnotic drugs, accidental (unintentional), initial encounter: Secondary | ICD-10-CM | POA: Insufficient documentation

## 2013-11-29 DIAGNOSIS — Y9389 Activity, other specified: Secondary | ICD-10-CM | POA: Insufficient documentation

## 2013-11-29 DIAGNOSIS — Y9289 Other specified places as the place of occurrence of the external cause: Secondary | ICD-10-CM | POA: Insufficient documentation

## 2013-11-29 DIAGNOSIS — T50901A Poisoning by unspecified drugs, medicaments and biological substances, accidental (unintentional), initial encounter: Secondary | ICD-10-CM

## 2013-11-29 DIAGNOSIS — Z87828 Personal history of other (healed) physical injury and trauma: Secondary | ICD-10-CM | POA: Insufficient documentation

## 2013-11-29 DIAGNOSIS — F17200 Nicotine dependence, unspecified, uncomplicated: Secondary | ICD-10-CM | POA: Insufficient documentation

## 2013-11-29 NOTE — ED Notes (Signed)
Pt ambulating independently w/ steady gait on d/c in no acute distress, A&Ox4. D/c instructions reviewed w/ pt and family - pt and family deny any further questions or concerns at present. Pt to be driven home by pt's father at bedside.

## 2013-11-29 NOTE — ED Notes (Signed)
Pt here for overdose, [place] [address] slumped [place] car, unresponsive, agonal but did have pulse, ems gave total of 4 of narcan intranasal and pt arousable and at baseline, pt admits to ED staff of taking roxycet x 2 or 3 for back pain that he got from a friend.

## 2013-11-29 NOTE — Discharge Instructions (Signed)
°Emergency Department Resource Guide °1) Find a Doctor and Pay Out of Pocket °Although you won't have to find out who is covered by your insurance plan, it is a good idea to ask around and get recommendations. You will then need to call the office and see if the doctor you have chosen will accept you as a new patient and what types of options they offer for patients who are self-pay. Some doctors offer discounts or will set up payment plans for their patients who do not have insurance, but you will need to ask so you aren't surprised when you get to your appointment. ° °2) Contact Your Local Health Department °Not all health departments have doctors that can see patients for sick visits, but many do, so it is worth a call to see if yours does. If you don't know where your local health department is, you can check in your phone book. The CDC also has a tool to help you locate your state's health department, and many state websites also have listings of all of their local health departments. ° °3) Find a Walk-in Clinic °If your illness is not likely to be very severe or complicated, you may want to try a walk in clinic. These are popping up all over the country in pharmacies, drugstores, and shopping centers. They're usually staffed by nurse practitioners or physician assistants that have been trained to treat common illnesses and complaints. They're usually fairly quick and inexpensive. However, if you have serious medical issues or chronic medical problems, these are probably not your best option. ° °No Primary Care Doctor: °- Call [facility] at  832-8000 - they can help you locate a primary care doctor that  accepts your insurance, provides certain services, etc. °- Physician Referral Service- 1-800-533-3463 ° °Chronic Pain Problems: °Organization         Address  Phone   Notes  °[facility]  (336) 297-2271 Patients need to be referred by their primary care doctor.  ° °Medication  Assistance: °Organization         Address  Phone   Notes  °[place] [facility] [address] °[place], [place] [address] (336) 641-8030 --Must be a resident of [place] °-- Must have NO insurance coverage whatsoever (no Medicaid/ Medicare, etc.) °-- The pt. MUST have a primary care doctor that directs their care regularly and follows them in the community °  °[facility]  (866) 331-1348   °[employer]  (888) 892-1162   ° °Agencies that provide inexpensive medical care: °Organization         Address  Phone   Notes  °[facility]  (336) 832-8035   °[facility]    (336) 832-7272   °[facility] [address] °[place], [place] [address] (336) 832-4777   °[facility] [address], °[place] (336) 271-4999   °[facility]    (336) 373-0678   °[facility]    (336) 272-1050   °[facility] ° [address], [place] Phone:  (336) 832-4444, Fax:  (336) 832-4440 Hours of Operation:  9 am - 6 pm, M-F.  Also accepts Medicaid/Medicare and self-pay.  °[facility] ° [address], [place] Phone: (336) 832-3150, Fax: (336) [phone]. Hours of Operation:  8:30 am - 5:30 pm, M-F.  Also accepts Medicaid and self-pay.  °[facility] [place] [address], [place] Phone: (336) 878-6027   °[facility] [address], [place], [place] (336)723-1848, Ext. 123 Mondays & Thursdays: 7-9 AM.  First 15 patients are seen on a first come, first serve basis. °  ° °Medicaid-accepting [place] Providers: ° °Organization         Address  Phone   Notes  °[facility] [address], [place] (336) 641-2100 Also accepts self-pay patients.  °[facility] [address], [place] ° (336) 856-9996   °[facility] [address], [place]  (336) 288-8857   °[facility] [address] [place] [address], [place] (336) 299-7000   °Veita Bland [address], [place]  ° (336) 373-1557 Only accepts [place] Access Medicaid patients after they have their name applied to their card.  ° °Self-Pay (no insurance) in [place]: ° °Organization         Address  Phone   Notes  °Sickle Cell Patients, [facility] [address], [place] (336) 832-1970   °[facility] [address], [place] (336) 832-4400   °[facility] ° [address], [place] (336) 992-4800   °[facility]/Dr. Osei-Bonsu ° [address] [place] [address], [place] or [address], [place] (336) 841-8500 Phone number for both [place] and [place] locations is the same.  °[facility] [address], [place] (336) 299-0000   °[facility] [address] [place] [address], [place] or [address] (336) 852-7530 °(336) 878-2260   °[facility] [address], [place] (336) 350-1642, phone; (336) 294-5005, fax Sees patients 1st and 3rd Saturday of every month.  Must not qualify for public or private insurance (i.e. Medicaid, Medicare, [place] Health Choice, Veterans' Benefits) • Household income should be no more than 200% of the poverty level •The clinic cannot treat you if you are pregnant or think you are pregnant • Sexually transmitted diseases are not treated at the clinic.  ° ° °Dental Care: °Organization         Address  Phone  Notes  °[place] [facility] [address], [place] (336) 641-6152 Accepts children up to age 21 who are enrolled in Medicaid or [place] Health Choice; pregnant women with a Medicaid card; and children who have applied for Medicaid or [place] Health Choice, but were declined, whose parents can pay a reduced fee at time of service.  °[place]  [facility] [place]  [address], [place] (336) 641-7733 Accepts children up to age 21 who are enrolled in Medicaid or [place] Health Choice; pregnant women with a Medicaid card; and children who have applied for Medicaid or [place] Health Choice, but were declined, whose parents can pay a reduced fee at time of service.  °[facility] ° [address],  (336) 641-4533 Patients are seen by appointment only. Walk-ins are not accepted. [facility] will see patients 18 years of age and older. °Monday - Tuesday (8am-5pm) °Most Wednesdays (8:30-5pm) °$30 per visit, cash only  °[facility] ° [address], [place] (336) 641-4533 Patients are seen by appointment only. Walk-ins are not accepted. [facility] will see patients 18 years of age and older. °One   Wednesday Evening (Monthly: Volunteer Based).  $30 per visit, cash only  °[facility]  (919) 537-3737 for adults; Children under age 4, call Graduate Pediatric Dentistry at ([phone]) [phone]. Children aged 4-14, please call (919) [phone] to request a pediatric application. ° Dental services are provided in all areas of dental care including fillings, crowns and bridges, complete and partial dentures, implants, gum treatment, root canals, and extractions. Preventive care is also provided. Treatment is provided to both adults and children. °Patients are selected via a lottery and there is often a waiting list. °  °[facility] [address], °[place] ° (336) 763-8833 [email or web address] °  °[facility] [address], [place], [place] (336)723-1848, Ext. 123 Second and Fourth Thursday of each month, opens at 6:30 AM; Clinic ends at 9 AM.  Patients are seen on a first-come first-served basis, and a limited number are seen during each clinic.  ° °[facility] ° [address], [place], [place] (336) 723-7904    Eligibility Requirements °You must have lived in [place], [place], or [place] counties for at least the last three months. °  You cannot be eligible for state or federal sponsored healthcare insurance, including Veterans Administration, Medicaid, or Medicare. °  You generally cannot be eligible for healthcare insurance through your employer.  °  How to apply: °Eligibility screenings are held every Tuesday and Wednesday afternoon from 1:00 pm until 4:00 pm. You do not need an appointment for the interview!  °[facility] [address], [place], [place] 336-631-2330   °[facility]  336-342-8273   °[facility]  336-703-3100   °[facility]  336-570-6415   ° °Behavioral Health Resources in the Community: °Intensive Outpatient Programs °Organization         Address  Phone  Notes  °[facility] [address], [place], [place] 336-878-6098   °[facility] [address], [place], [place] 336-832-9800   °[facility] [address], [place], [place] ° 336-882-2125   °[facility] [address],  °[place], [place] 1-800-853-5163 or 336-641-4981   °Substance Abuse Resources °Organization         Address  Phone  Notes  °[facility]  336-882-2125   °[facility]  336-784-9470   °[facility]  336-285-9073   °[facility]  336-845-3988   °Residential & Outpatient Substance Abuse Program  1-800-659-3381   °Psychological Services °Organization         Address  Phone  Notes  °[facility]  336- 832-9600   °[facility]  336- 378-7881   °[facility] [address], [place] 1-800-853-5163 or 336-641-4981   ° °Mobile Crisis Teams °Organization         Address  Phone  Notes  °[facility], Mobile Crisis Care Unit  1-877-626-1772   °Assertive °[facility] ° [address]  [place], [place] 336-834-9664   °Sharon DeEsch [address] °[place] 336-554-5454   ° °Self-Help/Support Groups °Organization         Address  Phone             Notes  °[facility]  - variety of support groups  336- 373-1402 Call for more information  °Narcotics Anonymous (NA), [facility] [address], °[place]  2 meetings at this location  ° °  Residential Treatment Programs °Organization         Address  Phone  Notes  °[facility] [address],    °[place]  1-866-801-8205   °[facility] ° [address], [place], [place] 704-293-8524   °[facility] [address], [place] 336-845-3988 Admissions: 8am-3pm M-F  °[facility] [address]    °[place], [place] 336-841-1104   °[facility] [address], [place], [place] 336-379-7146   °[facility] [address]  °[place], [place] 336-285-9073   °[facility] Intensive Outpatient [address], [place], [place] 336-852-3033   °[facility] [address]  °[place], [place] 1-877-615-2722 or 336-784-9470   °[facility] [address] [place], [place] 336-227-7417 Accepts Medicaid  °[facility] [address]  °[place] 1-800-659-3381 Substance Abuse/Addiction Treatment  ° °[place] Behavioral Health Resources °Organization         Address  Phone  Notes  °[facility]  (888) 581-9988   °Julie Brannon, PhD [address] [place], [place]   (336) 349-5553 or (336) 951-0000   °[facility]   [address] °[place], [place] (336) 349-4454   °[facility] [address], [place], [place] (336) 342-8316 Insurance/Medicaid/sponsorship through [facility]  °[facility] [address] [place] [address]                                    [place], [place] (336) 342-8316 Therapy/tele-psych/case    °[facility] [address]  ° [place], [place] (336) 349-2233    °Dr. Arfeen  (336) 349-4544   °[facility] [place]  [employer] [place] [facility] 1) [address], [place] °2) [address], [place] °3)  [address], [place] (336) 349-3220 °(336) 342-7768 ° °(336) 342-8140   °[place] [facility] (336) 342-1394 or (336) 342-3537 (After Hours)    ° ° °

## 2013-11-29 NOTE — ED Provider Notes (Signed)
CSN: [id number]     Arrival date & time 11/29/13  1825 History   First MD Initiated Contact with Patient 11/29/13 1832     Chief Complaint  Patient presents with  . Drug Overdose     (Consider location/radiation/quality/duration/timing/severity/associated sxs/prior Treatment) Patient is a 43 y.o. male presenting with Overdose.  Drug Overdose This is a new problem. The current episode started today. The problem occurs constantly. The problem has been resolved. Pertinent negatives include no abdominal pain, chest pain, chills, congestion, coughing, fatigue, fever, headaches, nausea, neck pain, numbness, rash, vertigo, vomiting or weakness. Nothing aggravates the symptoms. He has tried nothing for the symptoms. The treatment provided significant relief.    Past Medical History  Diagnosis Date  . TBI (traumatic brain injury)    No past surgical history on file. No family history on file. History  Substance Use Topics  . Smoking status: Current Every Day Smoker -- 1.00 packs/day    Types: Cigarettes  . Smokeless tobacco: Not on file  . Alcohol Use: Yes    Review of Systems  Constitutional: Negative for fever, chills and fatigue.  HENT: Negative for congestion and rhinorrhea.   Eyes: Negative for pain.  Respiratory: Negative for cough and shortness of breath.   Cardiovascular: Negative for chest pain and palpitations.  Gastrointestinal: Negative for nausea, vomiting, abdominal pain, diarrhea and constipation.  Endocrine: Negative for polydipsia and polyuria.  Genitourinary: Negative for dysuria and flank pain.  Musculoskeletal: Negative for back pain and neck pain.  Skin: Negative for color change, rash and wound.  Neurological: Negative for dizziness, vertigo, weakness, numbness and headaches.      Allergies  Review of patient's allergies indicates no known allergies.  Home Medications   Prior to Admission medications   Not on File   There were no vitals taken for this  visit. Physical Exam  Nursing note and vitals reviewed. Constitutional: He is oriented to person, place, and time. He appears well-developed and well-nourished.  HENT:  Head: Normocephalic and atraumatic.  Eyes: Conjunctivae and EOM are normal. Pupils are equal, round, and reactive to light.  Neck: Normal range of motion.  Cardiovascular: Normal rate and regular rhythm.   Pulmonary/Chest: Effort normal and breath sounds normal. He has no wheezes. He has no rales.  Abdominal: Soft. He exhibits no distension. There is no tenderness.  Musculoskeletal: Normal range of motion. He exhibits no edema and no tenderness.  Neurological: He is alert and oriented to person, place, and time.  Skin: Skin is warm and dry.    ED Course  Procedures (including critical care time) Labs Review Labs Reviewed - No data to display  Imaging Review No results found.   EKG Interpretation None      MDM   Final diagnoses:  None    Pertinent positives and negatives from above HPI, ROS and PE include: 43 yo M [facility]2 unresponsiveness on side of 29 w/ agonal respirations. 4mg  intranasal narcan and patient arousable and at baseline. Admits to taking roxicodone 3-4 unsure of dose. On exam here has equal unlabored breathing, no evidence of pulmonary edema, normal neuro exam. Pupils reactive and appropriate. Will observe to ensure patient stable when Narcan wears off  On re-evaluation patients VS were stable/patient observed for approx 2.5 hours without respiratory issues. Stable for d/c with family in the room.   Discharge instructions, including strict return precautions for new or worsening symptoms, given. Patient and/or family verbalized understanding and agreement with the plan  as described.   Labs, studies and imaging reviewed by myself and considered in medical decision making if ordered. Imaging interpreted by radiology. Pt was discussed with my attending, Dr. Deretha EmoryZackowski.  Discharge  Medication List as of 11/29/2013  9:27 PM      Follow-up Information   Follow up with No PCP Per Patient.   [facility]  [facility]         Marily MemosJason Mesner, MD 11/30/13 [phone]

## 2013-12-14 NOTE — ED Provider Notes (Signed)
I saw and evaluated the patient, reviewed the resident's note and I agree with the findings and plan.   EKG Interpretation   Date/Time:  Friday November 29 2013 18:33:14 EDT Ventricular Rate:  102 PR Interval:  154 QRS Duration: 76 QT Interval:  356 QTC Calculation: 464 R Axis:   64 Text Interpretation:  Sinus tachycardia Borderline T wave abnormalities  Confirmed by ZACKOWSKI  MD, SCOTT [phone]([id number]) on 11/29/2013 7:18:29 PM      Results for orders placed during the hospital encounter of 02/15/13  URINE RAPID DRUG SCREEN (HOSP PERFORMED)      Result Value Ref Range   Opiates POSITIVE (*) NONE DETECTED   Cocaine NONE DETECTED  NONE DETECTED   Benzodiazepines POSITIVE (*) NONE DETECTED   Amphetamines NONE DETECTED  NONE DETECTED   Tetrahydrocannabinol NONE DETECTED  NONE DETECTED   Barbiturates NONE DETECTED  NONE DETECTED  ETHANOL      Result Value Ref Range   Alcohol, Ethyl (B) <11  0 - 11 mg/dL  CBC WITH DIFFERENTIAL      Result Value Ref Range   WBC 8.6  4.0 - 10.5 K/uL   RBC 4.37  4.22 - 5.81 MIL/uL   Hemoglobin 13.8  13.0 - 17.0 g/dL   HCT 57.840.1  46.939.0 - 62.952.0 %   MCV 91.8  78.0 - 100.0 fL   MCH 31.6  26.0 - 34.0 pg   MCHC 34.4  30.0 - 36.0 g/dL   RDW 52.812.8  41.311.5 - 24.415.5 %   Platelets 326  150 - 400 K/uL   Neutrophils Relative % 64  43 - 77 %   Neutro Abs 5.5  1.7 - 7.7 K/uL   Lymphocytes Relative 26  12 - 46 %   Lymphs Abs 2.3  0.7 - 4.0 K/uL   Monocytes Relative 8  3 - 12 %   Monocytes Absolute 0.7  0.1 - 1.0 K/uL   Eosinophils Relative 2  0 - 5 %   Eosinophils Absolute 0.2  0.0 - 0.7 K/uL   Basophils Relative 0  0 - 1 %   Basophils Absolute 0.0  0.0 - 0.1 K/uL  COMPREHENSIVE METABOLIC PANEL      Result Value Ref Range   Sodium 138  135 - 145 mEq/L   Potassium 3.5  3.5 - 5.1 mEq/L   Chloride 102  96 - 112 mEq/L   CO2 27  19 - 32 mEq/L   Glucose, Bld 174 (*) 70 - 99 mg/dL   BUN 14  6 - 23 mg/dL   Creatinine, Ser 0.101.03  0.50 - 1.35 mg/dL   Calcium 8.4  8.4 - 27.210.5 mg/dL    Total Protein 6.9  6.0 - 8.3 g/dL   Albumin 3.6  3.5 - 5.2 g/dL   AST 26  0 - 37 U/L   ALT 28  0 - 53 U/L   Alkaline Phosphatase 80  39 - 117 U/L   Total Bilirubin 0.2 (*) 0.3 - 1.2 mg/dL   GFR calc non Af Amer 89 (*) >90 mL/min   GFR calc Af Amer >90  >90 mL/min  ACETAMINOPHEN LEVEL      Result Value Ref Range   Acetaminophen (Tylenol), Serum <15.0  10 - 30 ug/mL  SALICYLATE LEVEL      Result Value Ref Range   Salicylate Lvl <2.0 (*) 2.8 - 20.0 mg/dL  TROPONIN I      Result Value Ref Range   Troponin I <0.30  <0.30  ng/mL   No results found.  43 year old male seen by me. Patient was brought in by EMS found unresponsive along [address]. They gave him Narcan in route and patient woke up. The patient is telling us he does abuse narcotic pain pills. Patient denied any suicidal ideation. Patient's physical exam without any significant findings. Patient was observed for 2-1/2 hours. No recurrence of any symptoms or sedation. Patient given cautions and warnings about his behavior patient wanted to be discharged home. Patient discharged home.  Vanetta MuldersScott Zackowski, MD 12/14/13 [phone]

## 2017-01-10 DIAGNOSIS — M546 Pain in thoracic spine: Secondary | ICD-10-CM | POA: Diagnosis not present

## 2017-01-10 DIAGNOSIS — Z720 Tobacco use: Secondary | ICD-10-CM | POA: Diagnosis not present

## 2017-01-10 DIAGNOSIS — M545 Low back pain: Secondary | ICD-10-CM | POA: Diagnosis not present

## 2017-02-21 DIAGNOSIS — M549 Dorsalgia, unspecified: Secondary | ICD-10-CM | POA: Diagnosis not present

## 2017-03-30 DIAGNOSIS — Z720 Tobacco use: Secondary | ICD-10-CM | POA: Diagnosis not present

## 2017-03-30 DIAGNOSIS — M549 Dorsalgia, unspecified: Secondary | ICD-10-CM | POA: Diagnosis not present

## 2017-04-06 ENCOUNTER — Ambulatory Visit (HOSPITAL_COMMUNITY)
Admission: RE | Admit: 2017-04-06 | Discharge: 2017-04-06 | Disposition: A | Discharge status: Home / Self Care | Payer: BLUE CROSS/BLUE SHIELD | Source: Ambulatory Visit | Attending: *Deleted | Admitting: *Deleted

## 2017-04-06 ENCOUNTER — Other Ambulatory Visit (HOSPITAL_COMMUNITY): Payer: Self-pay | Admitting: *Deleted

## 2017-04-06 DIAGNOSIS — G8929 Other chronic pain: Secondary | ICD-10-CM | POA: Insufficient documentation

## 2017-04-06 DIAGNOSIS — M545 Low back pain: Secondary | ICD-10-CM

## 2017-04-06 DIAGNOSIS — M549 Dorsalgia, unspecified: Secondary | ICD-10-CM | POA: Insufficient documentation

## 2017-04-19 ENCOUNTER — Telehealth (HOSPITAL_COMMUNITY): Payer: Self-pay | Admitting: General Practice

## 2017-04-19 NOTE — Telephone Encounter (Signed)
04/19/17  Left a message to reschedule the eval on 11/9

## 2017-04-21 ENCOUNTER — Encounter (HOSPITAL_COMMUNITY): Payer: Self-pay

## 2017-04-21 ENCOUNTER — Ambulatory Visit (HOSPITAL_COMMUNITY): Payer: BLUE CROSS/BLUE SHIELD | Attending: *Deleted

## 2017-04-21 ENCOUNTER — Ambulatory Visit (HOSPITAL_COMMUNITY): Payer: BLUE CROSS/BLUE SHIELD

## 2017-04-21 DIAGNOSIS — M5441 Lumbago with sciatica, right side: Secondary | ICD-10-CM | POA: Diagnosis not present

## 2017-04-21 DIAGNOSIS — M6281 Muscle weakness (generalized): Secondary | ICD-10-CM | POA: Insufficient documentation

## 2017-04-21 DIAGNOSIS — M5442 Lumbago with sciatica, left side: Secondary | ICD-10-CM | POA: Insufficient documentation

## 2017-04-21 DIAGNOSIS — R293 Abnormal posture: Secondary | ICD-10-CM | POA: Diagnosis not present

## 2017-04-21 DIAGNOSIS — G8929 Other chronic pain: Secondary | ICD-10-CM | POA: Insufficient documentation

## 2017-04-21 NOTE — Therapy (Addendum)
[facility] [address] [place], [place], [address] Phone: [phone]   Fax:  [phone]  Physical Therapy Evaluation  Patient Details  Name: Jordan Wise MRN: [id number] Date of Birth: February 09, 1971 Referring Provider: Tylene FantasiaMuse, Rochelle D., PA-C   Encounter Date: 04/21/2017  PT End of Session - 04/21/17 1635    Visit Number  1    Number of Visits  9    Date for PT Re-Evaluation  05/19/17    Authorization Type  [employer] Other    Authorization Time Period  04/21/17 to 05/19/17    PT Start Time  1431    PT Stop Time  1513    PT Time Calculation (min)  42 min    Activity Tolerance  Patient tolerated treatment well;No increased pain    Behavior During Therapy  WFL for tasks assessed/performed       Past Medical History:  Diagnosis Date  . TBI (traumatic brain injury) [facility](HCC)     History reviewed. No pertinent surgical history.  There were no vitals filed for this visit.   Subjective Assessment - 04/21/17 1437    Subjective  Pt states that he has been having back pain for about 20 years. He states that he has pain in his lower back that hurts all the time and then he has intermittent pain in his lower thoracic spine. He states that he has n/t that can go down both legs, especially when his thoracic pain flares up. He states that he has some L radicular symptoms with his lower back pain. He reports BLE weakness somewhat. Laying in his bed in the fetal position with a pillow between his legs is his only relieving factors. Sitting, bending over aggravate his pain.     Patient is accompained by:  Family member Father    Limitations  House hold activities;Sitting;Lifting    How long can you sit comfortably?  sometimes 2-3 hours    How long can you stand comfortably?  half a day    How long can you walk comfortably?  slows him down    Patient Stated Goals  get his back where it doesn't flare up/decrease his pain    Currently in Pain?  Yes    Pain  Score  3     Pain Location  Back    Pain Orientation  Right;Left;Lower    Pain Descriptors / Indicators  Aching    Pain Type  Chronic pain    Pain Onset  More than a month ago    Pain Frequency  Constant    Aggravating Factors   sitting for too long, bending forward    Pain Relieving Factors  laying in fetal position with pillow between legs    Effect of Pain on Daily Activities  increases         [facility] PT Assessment - 04/21/17 0001      Assessment   Medical Diagnosis  Chronic thoracic and lumbar back pain    Referring Provider  Muse, Verdell Faceochelle D., PA-C    Onset Date/Surgical Date  -- years ago    Next MD Visit  -- needs to make an appointment    Prior Therapy  none      Balance Screen   Has the patient fallen in the past 6 months  Yes    How many times?  1    Has the patient had a decrease in activity level because of a fear of falling?  No    Is the patient reluctant to leave their home because of a fear of falling?   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; laying pipe, running heavy equipment, building roads    Leisure  hunting, fishing      Observation/Other Assessments   Observations  DTRs: L3/knee jerk: 2+     Focus on Therapeutic Outcomes (FOTO)   64%      Posture/Postural Control   Posture/Postural Control  Postural limitations    Postural Limitations  Rounded Shoulders;Forward head;Increased thoracic kyphosis;Increased lumbar lordosis      ROM / Strength   AROM / PROM / Strength  AROM;Strength      AROM   Lumbar Flexion  25% limited; RFIS x10: no change in symptoms following REIS    Lumbar Extension  slight increased lordosis; REIS x10 reps increased pain    Lumbar - Right Side Bend  WNL    Lumbar - Left Side Bend  WNL    Lumbar - Right Rotation  WNL, slight pain    Lumbar - Left Rotation  WNL, slight pain    Thoracic Flexion  increased kyphosis    Thoracic Extension   50% limited    Thoracic - Right Side Bend  WNL    Thoracic - Left Side Bend  WNL    Thoracic - Right Rotation  25% limited, less pain    Thoracic - Left Rotation  25% limited, pain      Strength   Right Hip Flexion  5/5    Right Hip Extension  4/5    Right Hip ABduction  4/5    Left Hip Flexion  4+/5    Left Hip Extension  4/5    Left Hip ABduction  4+/5    Right Knee Flexion  5/5    Right Knee Extension  5/5    Left Knee Flexion  4+/5    Left Knee Extension  5/5    Right Ankle Dorsiflexion  5/5    Left Ankle Dorsiflexion  5/5      Palpation   Spinal mobility  Central PA joint mobs: thoracic spine hypomobile and tender to palpation; lumbar spine mild hypomobility; pt especially tender to mobs around T12-L1    Palpation comment  increased soft tissue restrictions and tenderness to palpation of bil thoracic and lumbar paraspinals; he reported that palpation recreated his pain      Special Tests    Special Tests  Lumbar    Lumbar Tests  Slump Test;Straight Leg Raise;other      Slump test   Findings  Positive    Side  Left    Comment  slightly positive on the L; he reported LBP and some symptoms down the LLE      Straight Leg Raise   Findings  Positive    Side   Right    Comment  mildly positive; pt reported some LLE symptoms during RLE passive SLR;      other   Findings  Negative    Comments  Clonus negative BLE      Ambulation/Gait   Ambulation Distance (Feet)  766 Feet    Assistive device  None    Gait Pattern  Step-through pattern;Trendelenburg Tberg LLE > RLE    Gait Comments  pt started at 2/10 LBP and no change in symptoms  during      Balance   Balance Assessed  Yes      Static Standing Balance   Static Standing - Balance Support  No upper extremity supported    Static Standing Balance -  Activities   Single Leg Stance - Right Leg;Single Leg Stance - Left Leg    Static Standing - Comment/# of Minutes  R: 14 sec, L: 30 sec      Standardized Balance  Assessment   Standardized Balance Assessment  Five Times Sit to Stand    Five times sit to stand comments   16 sec         Objective measurements completed on examination: See above findings.      PT Education - 04/21/17 1516    Education provided  Yes    Education Details  exam findings, POC, HEP    Person(s) Educated  Patient;Parent(s)    Methods  Explanation;Demonstration    Comprehension  Verbalized understanding;Returned demonstration       PT Short Term Goals - 04/21/17 1700      PT SHORT TERM GOAL #1   Title  Pt will be independent with HEP and perform consistently in order to maximize overall function and decrease pain.    Time  2    Period  Weeks    Status  New    Target Date  05/05/17      PT SHORT TERM GOAL #2   Title  Pt will have improved R SLS to 30 sec or to be symmetrical with LLE in order to maximize balance.     Time  2    Period  Weeks    Status  New      PT SHORT TERM GOAL #3   Title  Pt will have improved BLE MMT to 4+/5 or > of bil hips to maximize gait, decrease LBP, and maximize his ability to lift at work.    Time  2    Period  Weeks    Status  New        PT Long Term Goals - 04/21/17 1707      PT LONG TERM GOAL #1   Title  Pt will have improved 5xSTS to 10 sec or < with no pain to demo improved functional BLE strength.    Time  4    Period  Weeks    Status  New    Target Date  05/19/17      PT LONG TERM GOAL #2   Title  Pt will have improved thoracic extension and lumbar flexion ROM by 25% or > in order to decrease pain and maximize posture.    Time  4    Period  Weeks    Status  New      PT LONG TERM GOAL #3   Title  Pt will be able to attain and maintain proper sitting posture 75% of the time or > with min to no cueing in order to decrease pain with sitting and maximize his ability to drive for longer periods of time.    Time  4    Period  Weeks    Status  New      PT LONG TERM GOAL #4   Title  Pt will be able to  demonstrate proper lifting mechanics of at least 25# from floor to chest with no cues or LBP in order to maximize his function at work.    Time  4  Period  Weeks    Status  New             Plan - 04/21/17 1635    Clinical Impression Statement  Pt is 46 YO M who presents to OPPT with c/o chronic lower-thoracic/LBP. Pt reports radicular symptoms down BLE, mainly LLE. Pt had mildly positive SLR on RLE as he had some LLE pain during R passive SLR, but he was negative with passive L SLR as he had no RLE symptoms, only LBP. Pt DTRs 2+ bil knee jerk and negative for clonus BLE. Pt currently presents with deficits in posture, thoracic extension ROM, lumbar flexion ROM, bil hip weakness, spinal hypomobility, soft tissue restrictions of thoracic and lumbar paraspinals, as well as deficits in SLS and functional strength. Pt would benefit from skilled PT services to address these deficits in order to maximize return to PLOF.    History and Personal Factors relevant to plan of care:  TBI 3-4 years ago after tree fell on pt (he denies any residual deficits, only some Has and migraines)   Clinical Presentation  Stable    Clinical Presentation due to:  spinal ROM and hypomobility, MMT, posture, SLS, soft tissue restrictions    Clinical Decision Making  Low    Rehab Potential  Fair    PT Frequency  2x / week    PT Duration  4 weeks    PT Treatment/Interventions  ADLs/Self Care Home Management;Cryotherapy;Electrical Stimulation;Moist Heat;Traction;Gait training;Stair training;Functional mobility training;Therapeutic activities;Therapeutic exercise;Balance training;Neuromuscular re-education;Patient/family education;Manual techniques;Passive range of motion;Dry needling;Energy conservation;Taping    PT Next Visit Plan  review goals and HEP; improve thoracic extension and lumbar flexion ROM; BLE, core, and postural strengthening; possibly decompression exercises 1-5 if pt in increased pain; manual techniques  for spinal joint mobs (if with PT) and soft tissue restrictions    PT Home Exercise Plan  eval: [facility]    Consulted and Agree with Plan of Care  Patient;Family member/caregiver    Family Member Consulted  Father       Patient will benefit from skilled therapeutic intervention in order to improve the following deficits and impairments:  Decreased balance, Decreased mobility, Decreased range of motion, Decreased strength, Difficulty walking, Hypomobility, Increased fascial restricitons, Increased muscle spasms, Improper body mechanics, Postural dysfunction, Pain  Visit Diagnosis: Chronic bilateral low back pain with bilateral sciatica - Plan: PT plan of care cert/re-cert  Abnormal posture - Plan: PT plan of care cert/re-cert  Muscle weakness (generalized) - Plan: PT plan of care cert/re-cert     Problem List There are no active problems to display for this patient.      Jac CanavanBrooke Izea Livolsi PT, DPT  [facility] [address] [place], [place], [address] Phone: [phone]   Fax:  [phone]  Name: Jordan T Shatzer MRN: [id number] Date of Birth: [birth year]

## 2017-04-26 ENCOUNTER — Ambulatory Visit (HOSPITAL_COMMUNITY): Payer: BLUE CROSS/BLUE SHIELD | Admitting: Physical Therapy

## 2017-04-26 DIAGNOSIS — M6281 Muscle weakness (generalized): Secondary | ICD-10-CM

## 2017-04-26 DIAGNOSIS — G8929 Other chronic pain: Secondary | ICD-10-CM | POA: Diagnosis not present

## 2017-04-26 DIAGNOSIS — M5441 Lumbago with sciatica, right side: Principal | ICD-10-CM

## 2017-04-26 DIAGNOSIS — R293 Abnormal posture: Secondary | ICD-10-CM | POA: Diagnosis not present

## 2017-04-26 DIAGNOSIS — M5442 Lumbago with sciatica, left side: Principal | ICD-10-CM

## 2017-04-26 NOTE — Therapy (Addendum)
[facility] [address] [place], [place], [address] Phone: [phone]   Fax:  [phone]    Addendum 06/16/17: PHYSICAL THERAPY DISCHARGE SUMMARY  Visits from Start of Care: 2  Current functional level related to goals / functional outcomes: See last treatment note   Remaining deficits: See last treatment note   Education / Equipment: n/a Plan: Patient agrees to discharge.  Patient goals were not met. Patient is being discharged due to not returning since the last visit.  ?????     Geraldine Solar PT, DPT   Physical Therapy Treatment  Patient Details  Name: Jordan Wise MRN: [id number] Date of Birth: [birth year] Referring Provider: Raiford Simmonds., PA-C   Encounter Date: 04/26/2017  PT End of Session - 04/26/17 0907    Visit Number  2    Number of Visits  9    Date for PT Re-Evaluation  05/19/17    Authorization Type  [employer] Other    Authorization Time Period  04/21/17 to 05/19/17    PT Start Time  0818    PT Stop Time  0856    PT Time Calculation (min)  38 min    Activity Tolerance  Patient tolerated treatment well;No increased pain    Behavior During Therapy  WFL for tasks assessed/performed       Past Medical History:  Diagnosis Date  . TBI (traumatic brain injury) ([place])     No past surgical history on file.  There were no vitals filed for this visit.  Subjective Assessment - 04/26/17 0843    Subjective  PT states his pain is a 4/10 today.  States he is having slight radiating down the Lt LE.    Currently in Pain?  Yes    Pain Score  4     Pain Location  Back    Pain Orientation  Left;Right    Pain Descriptors / Indicators  Aching;Tingling    Pain Type  Chronic pain                      [facility] Adult PT Treatment/Exercise - 04/26/17 0001      Lumbar Exercises: Stretches   Active Hamstring Stretch  2 reps;30 seconds    Active Hamstring Stretch Limitations  with towel and shown in long  sitting    Single Knee to Chest Stretch  3 reps;30 seconds    Piriformis Stretch  30 seconds    Piriformis Stretch Limitations  seated each LE      Lumbar Exercises: Seated   Other Seated Lumbar Exercises  scap retraction 10 reps (w-back)      Lumbar Exercises: Supine   Bridge  10 reps    Other Supine Lumbar Exercises  decompression 1-5      Lumbar Exercises: Sidelying   Hip Abduction  10 reps      Lumbar Exercises: Prone   Straight Leg Raise  10 reps             PT Education - 04/26/17 0906    Education provided  Yes    Education Details  reviewed evaluation including goals and HEP.  Updated HEP to include decompression ex, hamstring and piriformis stretches    Person(s) Educated  Patient    Methods  Explanation;Demonstration;Tactile cues;Verbal cues;Handout    Comprehension  Returned demonstration;Verbal cues required;Tactile cues required;Need further instruction;Verbalized understanding       PT Short Term Goals - 04/21/17 1700  PT SHORT TERM GOAL #1   Title  Pt will be independent with HEP and perform consistently in order to maximize overall function and decrease pain.    Time  2    Period  Weeks    Status  New    Target Date  05/05/17      PT SHORT TERM GOAL #2   Title  Pt will have improved R SLS to 30 sec or to be symmetrical with LLE in order to maximize balance.     Time  2    Period  Weeks    Status  New      PT SHORT TERM GOAL #3   Title  Pt will have improved BLE MMT to 4+/5 or > of bil hips to maximize gait, decrease LBP, and maximize his ability to lift at work.    Time  2    Period  Weeks    Status  New        PT Long Term Goals - 04/21/17 1707      PT LONG TERM GOAL #1   Title  Pt will have improved 5xSTS to 10 sec or < with no pain to demo improved functional BLE strength.    Time  4    Period  Weeks    Status  New    Target Date  05/19/17      PT LONG TERM GOAL #2   Title  Pt will have improved thoracic extension and lumbar  flexion ROM by 25% or > in order to decrease pain and maximize posture.    Time  4    Period  Weeks    Status  New      PT LONG TERM GOAL #3   Title  Pt will be able to attain and maintain proper sitting posture 75% of the time or > with min to no cueing in order to decrease pain with sitting and maximize his ability to drive for longer periods of time.    Time  4    Period  Weeks    Status  New      PT LONG TERM GOAL #4   Title  Pt will be able to demonstrate proper lifting mechanics of at least 25# from floor to chest with no cues or LBP in order to maximize his function at work.    Time  4    Period  Weeks    Status  New            Plan - 04/26/17 0908    Clinical Impression Statement  Reviewed [facility] and goals per initial evaluation.  Pt wtih good form and hold times.  Instucted wtih additional stretches and strengthening exercises.  Updated HEP to include decompression exercises and stretches to target tightness.  Observed chronic poor posture and mechanics during session and will need more instruction with this.  No increased pain at end of session.    Rehab Potential  Fair    PT Frequency  2x / week    PT Duration  4 weeks    PT Treatment/Interventions  ADLs/Self Care Home Management;Cryotherapy;Electrical Stimulation;Moist Heat;Traction;Gait training;Stair training;Functional mobility training;Therapeutic activities;Therapeutic exercise;Balance training;Neuromuscular re-education;Patient/family education;Manual techniques;Passive range of motion;Dry needling;Energy conservation;Taping    PT Next Visit Plan  Next session begin thoracic excursion and postural 3 with theraband.  continue to educate on sitting posture and logroll technique for transfers.  Begin manual techniques for spinal joint mobs (if with PT) and soft tissue  restrictions if needed.     PT Home Exercise Plan  eval: [facility] 04/26/17: hamstring stretch, piriformis stretch, decompression 1-5    Consulted and Agree with  Plan of Care  Patient;Family member/caregiver    Family Member Consulted  Father       Patient will benefit from skilled therapeutic intervention in order to improve the following deficits and impairments:  Decreased balance, Decreased mobility, Decreased range of motion, Decreased strength, Difficulty walking, Hypomobility, Increased fascial restricitons, Increased muscle spasms, Improper body mechanics, Postural dysfunction, Pain  Visit Diagnosis: Chronic bilateral low back pain with bilateral sciatica  Abnormal posture  Muscle weakness (generalized)     Problem List There are no active problems to display for this patient.  Teena Irani, PTA/CLT [phone]  Teena Irani 04/26/2017, 9:17 AM  [place], [place], [address] Phone: [phone]   Fax:  [phone]  Name: Jordan Wise MRN: [id number] Date of Birth: Sep 17, 1970

## 2017-04-26 NOTE — Patient Instructions (Signed)
Hamstring Stretch (Sitting)    Sitting, extend one leg and place hands on same thigh for support. Keeping torso straight, lean forward, sliding hands down leg, until a stretch is felt in back of thigh. Hold _30__ seconds. Repeat with other leg.  Piriformis Stretch, Sitting    Sit, one ankle on opposite knee, same-side hand on crossed knee. Push down on knee, keeping spine straight. Lean torso forward, with flat back, until tension is felt in hamstrings and gluteals of crossed-leg side. Hold _30__ seconds. Repeat _3__ times per session. Do _2__ sessions per day.

## 2017-05-02 ENCOUNTER — Telehealth (HOSPITAL_COMMUNITY): Payer: Self-pay

## 2017-05-02 ENCOUNTER — Ambulatory Visit (HOSPITAL_COMMUNITY): Payer: BLUE CROSS/BLUE SHIELD

## 2017-05-02 NOTE — Telephone Encounter (Signed)
I called the patient's home phone number on file and spoke breifly with his father. I asked if the patient would be in today and his father stated he has already left for the appointment. I will call later today to speak with the patient and remind him of his next therapy appointment on 05/08/17 at 8:15 AM.  [name], PT, DPT Physical Therapist with [facility]  05/02/2017 8:36 AM

## 2017-05-02 NOTE — Telephone Encounter (Signed)
I attempted to call [place] at his home phone number to remind him of his next appointment. His father, Chelsea AusCharlie Obando, who the pateint lives with answered and informed me the patient was unavailable. He agreed to leave a message with the patient that his next appointment is on 05/08/17 at 8:15 AM.   [name], PT, DPT Physical Therapist with [facility]  05/02/2017 5:47 PM

## 2017-05-08 ENCOUNTER — Telehealth (HOSPITAL_COMMUNITY): Payer: Self-pay

## 2017-05-08 ENCOUNTER — Ambulatory Visit (HOSPITAL_COMMUNITY): Payer: BLUE CROSS/BLUE SHIELD

## 2017-05-08 NOTE — Telephone Encounter (Signed)
Mr. Jordan Wise did not arrive for his 8:15 AM appointment today so I attempted to call the patient to check that everything was ok and remind him of his next appointment. I called his home number on file however received a busy dial tone and was unable to leave a message. I will attempt to call the patient again today to remind him of his next scheduled appointment for 11/28 at 8:15 AM.  [name], PT, DPT Physical Therapist with [facility]  05/08/2017 8:35 AM

## 2017-05-10 ENCOUNTER — Telehealth (HOSPITAL_COMMUNITY): Payer: Self-pay

## 2017-05-10 ENCOUNTER — Ambulatory Visit (HOSPITAL_COMMUNITY): Payer: BLUE CROSS/BLUE SHIELD

## 2017-05-10 NOTE — Telephone Encounter (Signed)
No show #2; called re missed appointment. Left message reminding him of his next appointment on 05/15/17 and informed him that he will have to schedule appointments individually now, per [facility].   Jac CanavanBrooke Bobbette Eakes PT, DPT

## 2017-05-15 ENCOUNTER — Encounter (HOSPITAL_COMMUNITY): Payer: BLUE CROSS/BLUE SHIELD

## 2017-05-17 ENCOUNTER — Encounter (HOSPITAL_COMMUNITY): Payer: BLUE CROSS/BLUE SHIELD

## 2017-06-09 DIAGNOSIS — M545 Low back pain: Secondary | ICD-10-CM | POA: Diagnosis not present

## 2017-06-19 DIAGNOSIS — Z0000 Encounter for general adult medical examination without abnormal findings: Secondary | ICD-10-CM | POA: Diagnosis not present

## 2017-06-30 DIAGNOSIS — F17210 Nicotine dependence, cigarettes, uncomplicated: Secondary | ICD-10-CM | POA: Diagnosis not present

## 2017-07-21 DIAGNOSIS — M545 Low back pain: Secondary | ICD-10-CM | POA: Diagnosis not present

## 2017-08-24 DIAGNOSIS — M545 Low back pain: Secondary | ICD-10-CM | POA: Diagnosis not present

## 2017-08-24 DIAGNOSIS — Z6822 Body mass index (BMI) 22.0-22.9, adult: Secondary | ICD-10-CM | POA: Diagnosis not present

## 2017-09-21 DIAGNOSIS — Z6822 Body mass index (BMI) 22.0-22.9, adult: Secondary | ICD-10-CM | POA: Diagnosis not present

## 2017-09-21 DIAGNOSIS — M545 Low back pain: Secondary | ICD-10-CM | POA: Diagnosis not present

## 2018-01-17 DIAGNOSIS — T50904A Poisoning by unspecified drugs, medicaments and biological substances, undetermined, initial encounter: Secondary | ICD-10-CM | POA: Diagnosis not present

## 2018-01-17 DIAGNOSIS — R0902 Hypoxemia: Secondary | ICD-10-CM | POA: Diagnosis not present

## 2018-01-17 DIAGNOSIS — R0689 Other abnormalities of breathing: Secondary | ICD-10-CM | POA: Diagnosis not present

## 2018-01-17 DIAGNOSIS — R4020 Unspecified coma: Secondary | ICD-10-CM | POA: Diagnosis not present

## 2018-05-23 DIAGNOSIS — M5430 Sciatica, unspecified side: Secondary | ICD-10-CM | POA: Diagnosis not present

## 2018-05-23 DIAGNOSIS — Z6828 Body mass index (BMI) 28.0-28.9, adult: Secondary | ICD-10-CM | POA: Diagnosis not present

## 2018-12-31 DIAGNOSIS — L03113 Cellulitis of right upper limb: Secondary | ICD-10-CM | POA: Diagnosis not present

## 2018-12-31 DIAGNOSIS — F411 Generalized anxiety disorder: Secondary | ICD-10-CM | POA: Diagnosis not present

## 2019-10-12 DEATH — deceased
